# Patient Record
Sex: Male | Born: 1985 | Race: Black or African American | Hispanic: No | Marital: Single | Smoking: Current every day smoker
Health system: Southern US, Community
[De-identification: ages and names within clinical notes are randomized; demographics above are authoritative.]

---
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
## 2006-01-23 ENCOUNTER — Emergency Department: Payer: Self-pay | Admitting: Emergency Medicine

## 2006-04-24 ENCOUNTER — Emergency Department: Payer: Self-pay | Admitting: Emergency Medicine

## 2006-11-20 ENCOUNTER — Emergency Department: Payer: Self-pay

## 2007-01-27 IMAGING — CR DG CHEST 2V
1 series · 2 of 2 positions shown · non-contrast
Comparison: none

REASON FOR EXAM: chest pain
COMMENTS:

PROCEDURE:     DXR - DXR CHEST PA (OR AP) AND LATERAL  - April 30, 2005  [DATE]
RESULT:     Lungs are clear. The cardiac silhouette and visualized bony
skeleton are unremarkable.  Specifically, there does not appear to be
evidence of a pneumothorax.

[Series 1: view not recorded · 0.17mm/px · 2 of 2 slices shown]
[im 1/2]
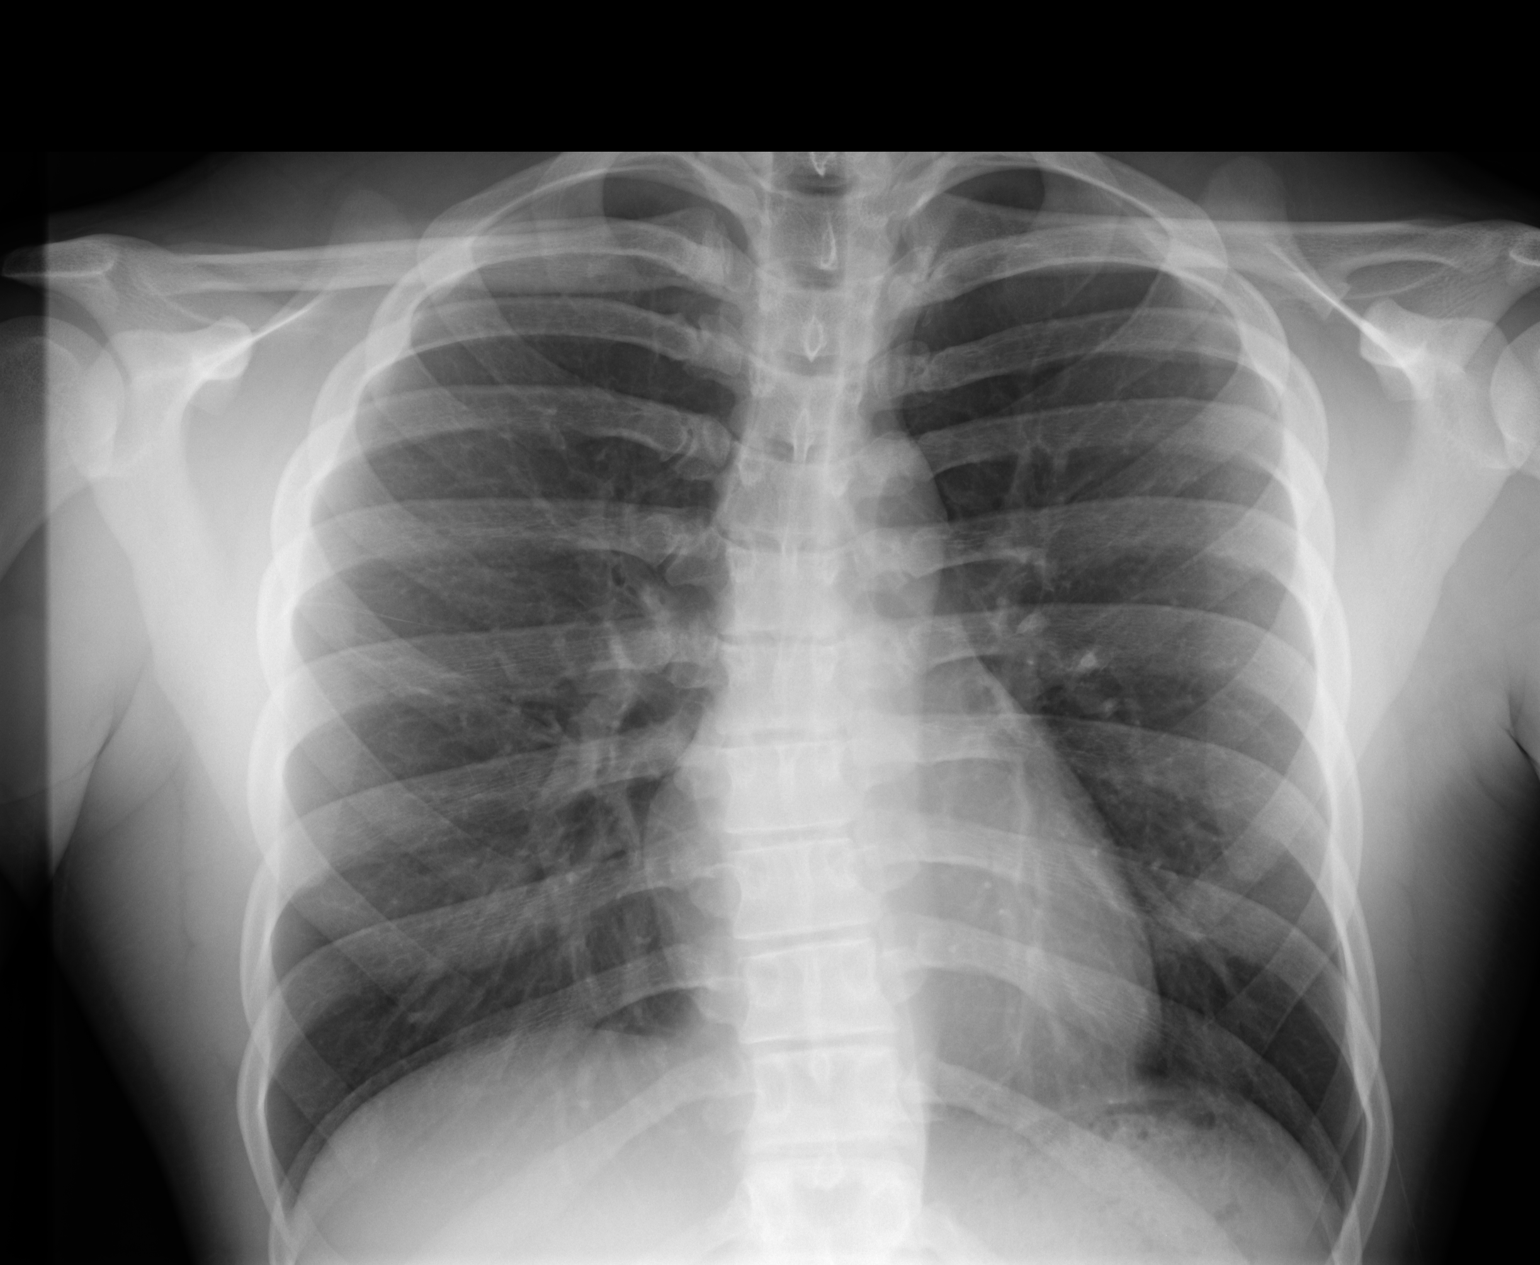
[im 2/2]
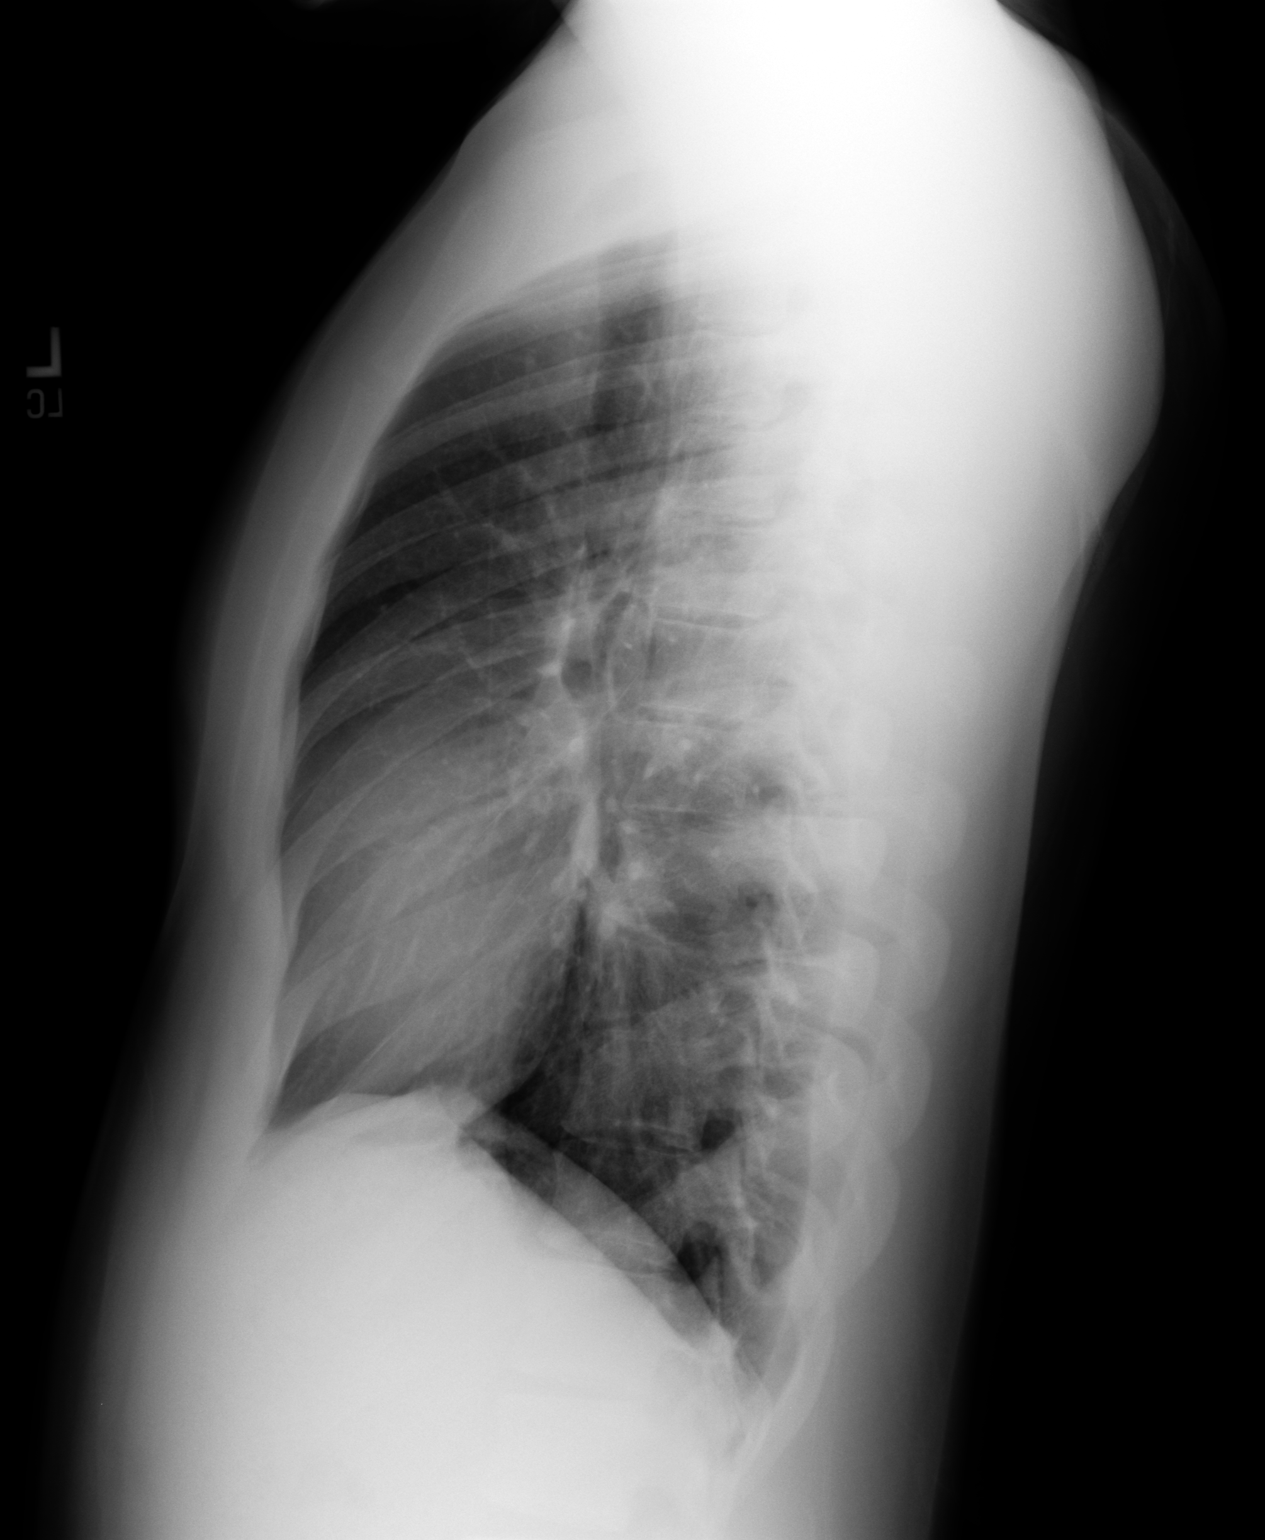

[2 of 2 positions shown; findings below may reference images not displayed]

IMPRESSION: 1)Chest radiograph without evidence of acute cardiopulmonary disease.

## 2010-06-22 ENCOUNTER — Emergency Department (HOSPITAL_COMMUNITY): Admission: EM | Admit: 2010-06-22 | Discharge: 2009-11-15 | Payer: Self-pay | Admitting: Emergency Medicine

## 2011-11-01 ENCOUNTER — Emergency Department: Payer: Self-pay | Admitting: Emergency Medicine

## 2012-11-07 ENCOUNTER — Emergency Department (HOSPITAL_COMMUNITY)
Admission: EM | Admit: 2012-11-07 | Discharge: 2012-11-07 | Disposition: A | Discharge status: Home / Self Care | Payer: Self-pay | Attending: Emergency Medicine | Admitting: Emergency Medicine

## 2012-11-07 ENCOUNTER — Emergency Department (HOSPITAL_COMMUNITY): Payer: Self-pay

## 2012-11-07 ENCOUNTER — Encounter (HOSPITAL_COMMUNITY): Payer: Self-pay | Admitting: Emergency Medicine

## 2012-11-07 DIAGNOSIS — Y9289 Other specified places as the place of occurrence of the external cause: Secondary | ICD-10-CM | POA: Insufficient documentation

## 2012-11-07 DIAGNOSIS — F17200 Nicotine dependence, unspecified, uncomplicated: Secondary | ICD-10-CM | POA: Insufficient documentation

## 2012-11-07 DIAGNOSIS — Y990 Civilian activity done for income or pay: Secondary | ICD-10-CM | POA: Insufficient documentation

## 2012-11-07 DIAGNOSIS — R209 Unspecified disturbances of skin sensation: Secondary | ICD-10-CM | POA: Insufficient documentation

## 2012-11-07 DIAGNOSIS — S46912A Strain of unspecified muscle, fascia and tendon at shoulder and upper arm level, left arm, initial encounter: Secondary | ICD-10-CM

## 2012-11-07 DIAGNOSIS — X500XXA Overexertion from strenuous movement or load, initial encounter: Secondary | ICD-10-CM | POA: Insufficient documentation

## 2012-11-07 DIAGNOSIS — IMO0002 Reserved for concepts with insufficient information to code with codable children: Secondary | ICD-10-CM | POA: Insufficient documentation

## 2012-11-07 MED ORDER — IBUPROFEN 800 MG PO TABS
800.0000 mg | ORAL_TABLET | Freq: Once | ORAL | Status: AC
  Administered 2012-11-07: 800 mg via ORAL
  Filled 2012-11-07: qty 1

## 2012-11-07 MED ORDER — NAPROXEN 500 MG PO TABS: 500.0000 mg | ORAL_TABLET | Freq: Two times a day (BID) | ORAL | Status: DC

## 2012-11-07 MED ORDER — HYDROCODONE-ACETAMINOPHEN 5-325 MG PO TABS
1.0000 | ORAL_TABLET | Freq: Four times a day (QID) | ORAL | Status: DC | PRN
Start: 2012-11-07 — End: 2013-05-18

## 2012-11-07 MED ORDER — HYDROCODONE-ACETAMINOPHEN 5-325 MG PO TABS
1.0000 | ORAL_TABLET | Freq: Once | ORAL | Status: AC
  Administered 2012-11-07: 1 via ORAL
  Filled 2012-11-07: qty 1

## 2012-11-07 NOTE — ED Notes (Signed)
Pt presenting to ed with c/o left shoulder injury today while moving furniture

## 2012-11-07 NOTE — ED Provider Notes (Signed)
History  This chart was scribed for non-physician practitioner Jaci Carrel, PA-C working with Celene Kras, MD, by Candelaria Stagers, ED Scribe. This patient was seen in room WTR9/WTR9 and the patient's care was started at 8:07 PM   CSN: [id number]  Arrival date & time 11/07/12  1818   First MD Initiated Contact with Patient 11/07/12 1934      Chief Complaint  Patient presents with  . Shoulder Pain     The history is provided by the patient. No language interpreter was used.   Caleb Pearson is a 27 y.o. male who presents to the Emergency Department complaining of left shoulder pain that started yesterday after moving furniture while at work and became worse today.  He states that today he felt a pop in the left shoulder when setting down a heavy object.  Pt reports some tingling to the left hand.  Pt states the pain is worse at night.  He has taken nothing for the pain.       History reviewed. No pertinent past medical history.  History reviewed. No pertinent past surgical history.  No family history on file.  History  Substance Use Topics  . Smoking status: Current Every Day Smoker    Types: Cigarettes  . Smokeless tobacco: Not on file  . Alcohol Use: No     Comment: rarely      Review of Systems ROS as mentioned in HPI.   Allergies  Review of patient's allergies indicates no known allergies.  Home Medications   Current Outpatient Rx  Name  Route  Sig  Dispense  Refill  . ibuprofen (ADVIL,MOTRIN) 200 MG tablet   Oral   Take 400 mg by mouth every 6 (six) hours as needed for pain.           BP 117/67  Pulse 71  Temp(Src) 98.4 F (36.9 C) (Oral)  Resp 18  SpO2 99%  Physical Exam  Nursing note and vitals reviewed. Constitutional: He appears well-developed and well-nourished. No distress.  HENT:  Head: Normocephalic and atraumatic.  Eyes: Conjunctivae and EOM are normal.  Neck: Normal range of motion. Neck supple.  Cardiovascular:  Intact distal  pulses, capillary refill < 3 seconds  Musculoskeletal:  Left shoulder: pain w active and passive ROM, ttp over labrum. Able to hold arm at 90 degrees. All other extremities with normal ROM  Neurological:  No sensory deficit  Skin: He is not diaphoretic.  Skin intact, no tenting    ED Course  Procedures   DIAGNOSTIC STUDIES: Oxygen Saturation is 99% on room air, normal by my interpretation.    COORDINATION OF CARE:  8:08 PM Discussed course o fcare with pt which includes conservative treatment for the next ten days.  Pt understands and agrees.   Labs Reviewed - No data to display Dg Shoulder Left  11/07/2012  *RADIOLOGY REPORT*  Clinical Data: Left shoulder injury 4 days ago with pain in the shoulder.  LEFT SHOULDER - 2+ VIEW  Comparison: None.  Findings: Internally rotated view demonstrates inferior spurring or osteochondroma along the lesser tuberosity.  AC joint unremarkable. No acute bony findings.  No dislocation. The acromial undersurface is type 2 (curved).  IMPRESSION:  1.  Inferior spurring or osteochondroma along the lesser tuberosity.   Otherwise, no significant abnormality identified.  If symptoms persist despite conservative therapy, MRI followup may be warranted.   Original Report Authenticated By: Gaylyn Rong, M.D.      No diagnosis found.  MDM  Shoulder strain Patient X-Ray negative for obvious fracture or dislocation. Pain managed in ED. Pt advised to follow up with orthopedics if symptoms persist. Conservative therapy recommended and discussed. Patient will be dc home & is agreeable with above plan.   I personally performed the services described in this documentation, which was scribed in my presence. The recorded information has been reviewed and is accurate.         Jaci Carrel, [place] 11/07/12 2031

## 2012-11-07 NOTE — ED Notes (Signed)
Pt has a ride home.  

## 2012-11-07 NOTE — ED Notes (Signed)
Pt ambulatory to exam room with steady gait. Pt arrives with companion.  

## 2012-11-08 NOTE — ED Provider Notes (Signed)
Medical screening examination/treatment/procedure(s) were performed by non-physician practitioner and as supervising physician I was immediately available for consultation/collaboration.     Shinita Mac R Shaine Newmark, MD 11/08/12 0005 

## 2013-05-18 ENCOUNTER — Emergency Department (HOSPITAL_COMMUNITY)
Admission: EM | Admit: 2013-05-18 | Discharge: 2013-05-18 | Disposition: A | Discharge status: Home / Self Care | Payer: Self-pay | Attending: Emergency Medicine | Admitting: Emergency Medicine

## 2013-05-18 ENCOUNTER — Emergency Department (HOSPITAL_COMMUNITY): Payer: Self-pay

## 2013-05-18 ENCOUNTER — Encounter (HOSPITAL_COMMUNITY): Payer: Self-pay | Admitting: Emergency Medicine

## 2013-05-18 DIAGNOSIS — Y9229 Other specified public building as the place of occurrence of the external cause: Secondary | ICD-10-CM | POA: Insufficient documentation

## 2013-05-18 DIAGNOSIS — S93609A Unspecified sprain of unspecified foot, initial encounter: Secondary | ICD-10-CM | POA: Insufficient documentation

## 2013-05-18 DIAGNOSIS — S96912A Strain of unspecified muscle and tendon at ankle and foot level, left foot, initial encounter: Secondary | ICD-10-CM

## 2013-05-18 DIAGNOSIS — Y990 Civilian activity done for income or pay: Secondary | ICD-10-CM | POA: Insufficient documentation

## 2013-05-18 DIAGNOSIS — R609 Edema, unspecified: Secondary | ICD-10-CM | POA: Insufficient documentation

## 2013-05-18 DIAGNOSIS — T733XXA Exhaustion due to excessive exertion, initial encounter: Secondary | ICD-10-CM | POA: Insufficient documentation

## 2013-05-18 DIAGNOSIS — F17200 Nicotine dependence, unspecified, uncomplicated: Secondary | ICD-10-CM | POA: Insufficient documentation

## 2013-05-18 NOTE — ED Notes (Signed)
Pt states that his left foot has been hurting and swollen for 2 weeks now with no known cause or reason for onset.

## 2013-05-18 NOTE — ED Provider Notes (Addendum)
CSN: [id number]     Arrival date & time 05/18/13  0736 History   First MD Initiated Contact with Patient 05/18/13 0737     Chief Complaint  Patient presents with  . Foot Pain    left   (Consider location/radiation/quality/duration/timing/severity/associated sxs/prior Treatment) Patient is a 27 y.o. male presenting with lower extremity pain. The history is provided by the patient.  Foot Pain This is a new problem. Episode onset: 2 weeks ago. The problem occurs constantly. The problem has been gradually worsening. Associated symptoms comments: Initially just hurt when wearing his work boots while at work.  He works at a [employer] and stands about 9 hours a day.  Then started noticing pain with wearing regular shoes and when walking in general.  Now it is swollen all the time and painful every time he walks.  No trauma. The symptoms are aggravated by walking. The symptoms are relieved by ice and rest. He has tried rest for the symptoms. The treatment provided no relief.    History reviewed. No pertinent past medical history. History reviewed. No pertinent past surgical history. History reviewed. No pertinent family history. History  Substance Use Topics  . Smoking status: Current Every Day Smoker    Types: Cigarettes  . Smokeless tobacco: Not on file  . Alcohol Use: No     Comment: rarely    Review of Systems  Constitutional: Negative for fever.  Musculoskeletal: Positive for joint swelling.  All other systems reviewed and are negative.    Allergies  Review of patient's allergies indicates no known allergies.  Home Medications   Current Outpatient Rx  Name  Route  Sig  Dispense  Refill  . HYDROcodone-acetaminophen (NORCO/VICODIN) 5-325 MG per tablet   Oral   Take 1 tablet by mouth every 6 (six) hours as needed for pain.   15 tablet   0   . ibuprofen (ADVIL,MOTRIN) 200 MG tablet   Oral   Take 400 mg by mouth every 6 (six) hours as needed for pain.         . naproxen  (NAPROSYN) 500 MG tablet   Oral   Take 1 tablet (500 mg total) by mouth 2 (two) times daily.   30 tablet   0    BP 134/80  Pulse 96  Temp(Src) 97.9 F (36.6 C) (Oral)  Resp 14  Ht 6' (1.829 m)  Wt 207 lb (93.895 kg)  BMI 28.07 kg/m2  SpO2 100% Physical Exam  Nursing note and vitals reviewed. Constitutional: He is oriented to person, place, and time. He appears well-developed and well-nourished. No distress.  HENT:  Head: Normocephalic and atraumatic.  Cardiovascular: Normal rate.   Pulmonary/Chest: Effort normal.  Musculoskeletal:       Left foot: He exhibits tenderness, bony tenderness and swelling. He exhibits normal range of motion, no crepitus and no deformity.       Feet:  Neurological: He is alert and oriented to person, place, and time.  Skin: Skin is warm and dry. No rash noted. No erythema.    ED Course  Procedures (including critical care time) Labs Review Labs Reviewed - No data to display Imaging Review Dg Foot Complete Left  05/18/2013   CLINICAL DATA:  Left foot pain the worse over the great toe and metatarsals. No injury.  EXAM: LEFT FOOT - COMPLETE 3+ VIEW  COMPARISON:  None.  FINDINGS: There is no evidence of fracture or dislocation. There is no evidence of arthropathy or other focal bone  abnormality. Soft tissues are unremarkable.  IMPRESSION: Negative.   Electronically Signed   By: Elberta Fortis M.D.   On: 05/18/2013 08:12    EKG Interpretation   None       MDM   1. Strain of foot, left, initial encounter     Patient with 2 weeks left first MTP joint tenderness with progressive swelling. He denies any trauma or injury patient initially noticed it when wearing his work boots and now it hurts constantly. Pain is more severe with weightbearing. 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  On exam mild swelling to the left first MTP joint and tenderness to palpation also significant pain with great toe extension. No tenderness  concerning for plantar fasciitis and no symptoms suggestive of gout or concern for septic joint.  Plain films pending.  8:23 AM Plain films neg.  Conservative measures at this time with insoles, ibuprofen, ice and rest.  Concern for possible early or developing stress fracture.  Will have him f/u with podiatry in 1 week if not improving.  Gwyneth Sprout, MD 05/18/13 [id number]  Gwyneth Sprout, MD 05/18/13 [phone]

## 2014-01-28 ENCOUNTER — Emergency Department (HOSPITAL_COMMUNITY)
Admission: EM | Admit: 2014-01-28 | Discharge: 2014-01-29 | Disposition: A | Discharge status: Home / Self Care | Payer: No Typology Code available for payment source | Attending: Emergency Medicine | Admitting: Emergency Medicine

## 2014-01-28 ENCOUNTER — Emergency Department (HOSPITAL_COMMUNITY): Payer: No Typology Code available for payment source

## 2014-01-28 ENCOUNTER — Encounter (HOSPITAL_COMMUNITY): Payer: Self-pay | Admitting: Emergency Medicine

## 2014-01-28 DIAGNOSIS — F17200 Nicotine dependence, unspecified, uncomplicated: Secondary | ICD-10-CM | POA: Insufficient documentation

## 2014-01-28 DIAGNOSIS — Y9389 Activity, other specified: Secondary | ICD-10-CM | POA: Insufficient documentation

## 2014-01-28 DIAGNOSIS — S4980XA Other specified injuries of shoulder and upper arm, unspecified arm, initial encounter: Secondary | ICD-10-CM | POA: Insufficient documentation

## 2014-01-28 DIAGNOSIS — S46909A Unspecified injury of unspecified muscle, fascia and tendon at shoulder and upper arm level, unspecified arm, initial encounter: Secondary | ICD-10-CM | POA: Insufficient documentation

## 2014-01-28 DIAGNOSIS — M25512 Pain in left shoulder: Secondary | ICD-10-CM

## 2014-01-28 DIAGNOSIS — S060X0A Concussion without loss of consciousness, initial encounter: Secondary | ICD-10-CM | POA: Insufficient documentation

## 2014-01-28 DIAGNOSIS — Y92410 Unspecified street and highway as the place of occurrence of the external cause: Secondary | ICD-10-CM | POA: Insufficient documentation

## 2014-01-28 MED ORDER — KETOROLAC TROMETHAMINE 30 MG/ML IJ SOLN
30.0000 mg | Freq: Once | INTRAMUSCULAR | Status: AC
  Administered 2014-01-28: 30 mg via INTRAMUSCULAR
  Filled 2014-01-28: qty 1
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
## 2014-01-29 MED ORDER — HYDROCODONE-ACETAMINOPHEN 5-325 MG PO TABS: 1.0000 | ORAL_TABLET | Freq: Four times a day (QID) | ORAL | Status: AC | PRN
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
## 2014-01-29 NOTE — ED Provider Notes (Signed)
Medical screening examination/treatment/procedure(s) were performed by non-physician practitioner and as supervising physician I was immediately available for consultation/collaboration.   EKG Interpretation None        Hanley SeamenJohn L Celsa Nordahl, MD 01/29/14 [phone]

## 2014-02-01 ENCOUNTER — Emergency Department (HOSPITAL_COMMUNITY)
Admission: EM | Admit: 2014-02-01 | Discharge: 2014-02-01 | Disposition: A | Discharge status: Home / Self Care | Payer: 59 | Attending: Emergency Medicine | Admitting: Emergency Medicine

## 2014-02-01 ENCOUNTER — Emergency Department (HOSPITAL_COMMUNITY): Payer: 59

## 2014-02-01 ENCOUNTER — Encounter (HOSPITAL_COMMUNITY): Payer: Self-pay | Admitting: Emergency Medicine

## 2014-02-01 DIAGNOSIS — G4489 Other headache syndrome: Secondary | ICD-10-CM | POA: Insufficient documentation

## 2014-02-01 DIAGNOSIS — G8911 Acute pain due to trauma: Secondary | ICD-10-CM | POA: Insufficient documentation

## 2014-02-01 DIAGNOSIS — F17200 Nicotine dependence, unspecified, uncomplicated: Secondary | ICD-10-CM | POA: Insufficient documentation

## 2014-02-01 DIAGNOSIS — M62838 Other muscle spasm: Secondary | ICD-10-CM | POA: Insufficient documentation

## 2014-02-01 DIAGNOSIS — R51 Headache: Secondary | ICD-10-CM | POA: Diagnosis present

## 2014-02-01 MED ORDER — KETOROLAC TROMETHAMINE 30 MG/ML IJ SOLN
30.0000 mg | Freq: Once | INTRAMUSCULAR | Status: AC
  Administered 2014-02-01: 30 mg via INTRAMUSCULAR
  Filled 2014-02-01: qty 1

## 2014-02-01 NOTE — ED Notes (Signed)
Went to room and patient has left.

## 2014-02-01 NOTE — Discharge Instructions (Signed)
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
## 2014-02-04 NOTE — ED Provider Notes (Signed)
Medical screening examination/treatment/procedure(s) were performed by non-physician practitioner and as supervising physician I was immediately available for consultation/collaboration.   EKG Interpretation None       Doug SouSam Galia Rahm, MD 02/04/14 [phone]

## 2014-02-08 ENCOUNTER — Other Ambulatory Visit: Payer: Self-pay | Admitting: Family Medicine

## 2014-02-08 DIAGNOSIS — M542 Cervicalgia: Secondary | ICD-10-CM

## 2014-02-13 ENCOUNTER — Ambulatory Visit
Admission: RE | Admit: 2014-02-13 | Discharge: 2014-02-13 | Disposition: A | Discharge status: Home / Self Care | Payer: 59 | Source: Ambulatory Visit | Attending: Family Medicine | Admitting: Family Medicine

## 2014-02-13 DIAGNOSIS — M542 Cervicalgia: Secondary | ICD-10-CM

## 2014-03-18 ENCOUNTER — Emergency Department (HOSPITAL_COMMUNITY)
Admission: EM | Admit: 2014-03-18 | Discharge: 2014-03-18 | Disposition: A | Discharge status: Home / Self Care | Payer: 59 | Attending: Emergency Medicine | Admitting: Emergency Medicine

## 2014-03-18 ENCOUNTER — Encounter (HOSPITAL_COMMUNITY): Payer: Self-pay | Admitting: Emergency Medicine

## 2014-03-18 DIAGNOSIS — F911 Conduct disorder, childhood-onset type: Secondary | ICD-10-CM | POA: Insufficient documentation

## 2014-03-18 DIAGNOSIS — R454 Irritability and anger: Secondary | ICD-10-CM

## 2014-03-18 DIAGNOSIS — F17200 Nicotine dependence, unspecified, uncomplicated: Secondary | ICD-10-CM | POA: Diagnosis not present

## 2014-03-18 NOTE — Discharge Instructions (Signed)
°Emergency Department Resource Guide °1) Find a Doctor and Pay Out of Pocket °Although you won't have to find out who is covered by your insurance plan, it is a good idea to ask around and get recommendations. You will then need to call the office and see if the doctor you have chosen will accept you as a new patient and what types of options they offer for patients who are self-pay. Some doctors offer discounts or will set up payment plans for their patients who do not have insurance, but you will need to ask so you aren't surprised when you get to your appointment. ° °2) Contact Your Local Health Department °Not all health departments have doctors that can see patients for sick visits, but many do, so it is worth a call to see if yours does. If you don't know where your local health department is, you can check in your phone book. The CDC also has a tool to help you locate your state's health department, and many state websites also have listings of all of their local health departments. ° °3) Find a Walk-in Clinic °If your illness is not likely to be very severe or complicated, you may want to try a walk in clinic. These are popping up all over the country in pharmacies, drugstores, and shopping centers. They're usually staffed by nurse practitioners or physician assistants that have been trained to treat common illnesses and complaints. They're usually fairly quick and inexpensive. However, if you have serious medical issues or chronic medical problems, these are probably not your best option. ° °No Primary Care Doctor: °- Call [facility] at  832-8000 - they can help you locate a primary care doctor that  accepts your insurance, provides certain services, etc. °- Physician Referral Service- 1-800-533-3463 ° °Chronic Pain Problems: °Organization         Address  Phone   Notes  °[facility]  (336) 297-2271 Patients need to be referred by their primary care doctor.  ° °Medication  Assistance: °Organization         Address  Phone   Notes  °[place] [facility] [address] °[place], [place] [address] (336) 641-8030 --Must be a resident of [place] °-- Must have NO insurance coverage whatsoever (no Medicaid/ Medicare, etc.) °-- The pt. MUST have a primary care doctor that directs their care regularly and follows them in the community °  °[facility]  (866) 331-1348   °[employer]  (888) 892-1162   ° °Agencies that provide inexpensive medical care: °Organization         Address  Phone   Notes  °[facility]  (336) 832-8035   °[facility]    (336) 832-7272   °[facility] [address] °[place], [place] [address] (336) 832-4777   °[facility] [address], °[place] (336) 271-4999   °[facility]    (336) 373-0678   °[facility]    (336) 272-1050   °[facility] ° [address], [place] Phone:  (336) 832-4444, Fax:  (336) 832-4440 Hours of Operation:  9 am - 6 pm, M-F.  Also accepts Medicaid/Medicare and self-pay.  °[facility] ° [address], [place] Phone: (336) 832-3150, Fax: (336) 832-3151. Hours of Operation:  8:30 am - 5:30 pm, M-F.  Also accepts Medicaid and self-pay.  °[facility] [place] [address], [place] Phone: (336) 878-6027   °[facility] [address], [place], [place] (336)723-1848, Ext. 123 Mondays & Thursdays: 7-9 AM.  First 15 patients are seen on a first come, first serve basis. °  ° °Medicaid-accepting [place] Providers: ° °Organization         Address  Phone   Notes  °[facility] [address], [place] (336) 641-2100 Also accepts self-pay patients.  °[facility] [address], [place] ° (336) 856-9996   °[facility] [address], [place]  (336) 288-8857   °[facility] [address] [place] [address], [place] (336) 299-7000   °Veita Bland [address], [place]  ° (336) 373-1557 Only accepts [place] Access Medicaid patients after they have their name applied to their card.  ° °Self-Pay (no insurance) in [place]: ° °Organization         Address  Phone   Notes  °Sickle Cell Patients, [facility] [address], [place] (336) 832-1970   °[facility] [address], [place] (336) 832-4400   °[facility] ° [address], [place] (336) 992-4800   °[facility]/Dr. Osei-Bonsu ° [address] [place] [address], [place] or [address], [place] (336) 841-8500 Phone number for both [place] and [place] locations is the same.  °[facility] [address], [place] (336) 299-0000   °[facility] [address] [place] [address], [place] or [address] (336) 852-7530 °(336) 878-2260   °[facility] [address], [place] (336) 350-1642, phone; (336) 294-5005, fax Sees patients 1st and 3rd Saturday of every month.  Must not qualify for public or private insurance (i.e. Medicaid, Medicare, [place] Health Choice, Veterans' Benefits) • Household income should be no more than 200% of the poverty level •The clinic cannot treat you if you are pregnant or think you are pregnant • Sexually transmitted diseases are not treated at the clinic.  ° ° °Dental Care: °Organization         Address  Phone  Notes  °[place] [facility] [address], [place] (336) 641-6152 Accepts children up to age 21 who are enrolled in Medicaid or [place] Health Choice; pregnant women with a Medicaid card; and children who have applied for Medicaid or [place] Health Choice, but were declined, whose parents can pay a reduced fee at time of service.  °[place]  [facility] [place]  [address], [place] (336) 641-7733 Accepts children up to age 21 who are enrolled in Medicaid or [place] Health Choice; pregnant women with a Medicaid card; and children who have applied for Medicaid or [place] Health Choice, but were declined, whose parents can pay a reduced fee at time of service.  °[facility] ° [address], [place] (336) 641-4533 Patients are seen by appointment only. Walk-ins are not accepted. [facility] will see patients 18 years of age and older. °Monday - Tuesday (8am-5pm) °Most Wednesdays (8:30-5pm) °$30 per visit, cash only  °[facility] ° [address], [place] (336) 641-4533 Patients are seen by appointment only. Walk-ins are not accepted. [facility] will see patients 18 years of age and older. °One   Wednesday Evening (Monthly: Volunteer Based).  $30 per visit, cash only  °[facility]  (919) 537-3737 for adults; Children under age 4, call Graduate Pediatric Dentistry at (919) [phone]. Children aged 4-14, please call (919) [phone] to request a pediatric application. ° Dental services are provided in all areas of dental care including fillings, crowns and bridges, complete and partial dentures, implants, gum treatment, root canals, and extractions. Preventive care is also provided. Treatment is provided to both adults and children. °Patients are selected via a lottery and there is often a waiting list. °  °[facility] [address], °[place] ° (336) 763-8833 [email or web address] °  °[facility] [address], [place], [place] (336)723-1848, Ext. 123 Second and Fourth Thursday of each month, opens at 6:30 AM; Clinic ends at 9 AM.  Patients are seen on a first-come first-served basis, and a limited number are seen during each clinic.  ° °[facility] ° [address], [place], [place] (336) 723-7904    Eligibility Requirements °You must have lived in [place], or [place] counties for at least the last three months. °  You cannot be eligible for state or federal sponsored healthcare insurance, including Veterans Administration, Medicaid, or Medicare. °  You generally cannot be eligible for healthcare insurance through your employer.  °  How to apply: °Eligibility screenings are held every Tuesday and Wednesday afternoon from 1:00 pm until 4:00 pm. You do not need an appointment for the interview!  °[facility] [address], [place], [place] 336-631-2330   °[facility]  336-342-8273   °[facility]  336-703-3100   °[facility]  336-570-6415   ° °Behavioral Health Resources in the Community: °Intensive Outpatient Programs °Organization         Address  Phone  Notes  °[facility] [address], [place], [place] 336-878-6098   °[facility] [address], [place], [place] 336-832-9800   °[facility] [address], [place], [place] ° 336-882-2125   °[facility] [address],  °[place], [place] 1-800-853-5163 or 336-641-4981   °Substance Abuse Resources °Organization         Address  Phone  Notes  °[facility]  336-882-2125   °[facility]  336-784-9470   °[facility]  336-285-9073   °[facility]  336-845-3988   °Residential & Outpatient Substance Abuse Program  1-800-659-3381   °Psychological Services °Organization         Address  Phone  Notes  °[facility]  336- 832-9600   °[facility]  336- 378-7881   °[facility] [address], [place] 1-800-853-5163 or 336-641-4981   ° °Mobile Crisis Teams °Organization         Address  Phone  Notes  °[facility], Mobile Crisis Care Unit  1-877-626-1772   °Assertive °[facility] ° [address]  [place], [place] 336-834-9664   °Sharon DeEsch [address] °[place] 336-554-5454   ° °Self-Help/Support Groups °Organization         Address  Phone             Notes  °[facility]  - variety of support groups  336- 373-1402 Call for more information  °Narcotics Anonymous (NA), [facility] [address], °[place]  2 meetings at this location  ° °  Residential Treatment Programs Organization         Address  Phone  Notes  [facility] [address]    [place]  1-610-960-4540   [facility]  [address], [place] [address], [place], [place] 478-295-6213   [facility] [address] [place], [place] 086-578-4696 Admissions: 8am-3pm M-F  [facility] [address],    [place], [place] 295-284-1324   [facility] [address] [place], [place], [place] 401-027-2536   [facility] [address],  [place], [place] 644-034-7425   [facility] Intensive Outpatient [address], [place], [place] 956-387-5643   [facility] ([facility] [address] [place]  [place], [place] 3-295-188-4166 or (717)054-7060   [facility] ([facility] [address], [place], [place] 323-557-3220 Accepts Medicaid  [facility] [address],  [place] 2-542-706-2376 Substance Abuse/Addiction Treatment   [facility]         Address  Phone  Notes  [facility]  (337) 662-2099   Angie Fava, PhD [address] [place], [place]   906-863-4272 or 857-474-9312   [facility]   [address] [place], [place] 310-217-7482   [facility] [address] [place], [place] 409-056-5172 Insurance/Medicaid/sponsorship through [facility] [address]                                    [place], [place] 725-854-1708 Therapy/tele-psych/case    [facility] [address], [place] (517) [phone]    Dr. Lolly Mustache  819-445-2507   [facility]  [employer] [facility]. 1) [address] [place] [address], [place] 2) [address] [place] 3)  [address], [place] [phone]  [phone]   [facility] Child Abuse Hotline (713)391-5973 or 769-002-4787 (After Hours)        Anger Management Anger is a normal human emotion. However, anger can range from mild irritation to rage. When your anger becomes harmful to yourself or others, it is unhealthy anger.  CAUSES  There are many reasons for unhealthy anger. Many people learn how to express anger from observing how their family expressed anger. In troubled, chaotic, or abusive families, anger can be expressed as rage or even violence. Children can grow up never learning how healthy anger can be expressed. Factors that contribute to unhealthy anger include:   Drug or alcohol abuse.  Post-traumatic stress disorder.  Traumatic brain injury. COMPLICATIONS  People with unhealthy anger tend to overreact and retaliate against a real or imagined threat. The need to retaliate can turn into violence or verbal abuse against another person. Chronic anger can lead to health problems, such as hypertension, high blood pressure, and depression. TREATMENT  Exercising, relaxing, meditating, or writing out your feelings all can be beneficial in managing moderate anger. For unhealthy anger, the following methods may be used:  Cognitive-behavioral counseling (learning skills to change the thoughts that influence your mood).  Relaxation training.  Interpersonal counseling.  Assertive communication skills.  Medication. Document Released: 04/29/2007 Document Revised: 09/24/2011 Document Reviewed: 09/07/2010 [facility] Patient Information 2015 [place], [place]. This information is not intended to replace advice given to you by your  health  care provider. Make sure you discuss any questions you have with your health care provider.

## 2014-03-18 NOTE — ED Notes (Signed)
Pt called [facility] this afternoon because he was having homicidal thoughts earlier but not currently. He also suffers from PTSD and has never been treated for it. Pt states that he's anxious and gets angry very quickly. Pt requesting help with PTSD and anxiety.

## 2014-03-19 ENCOUNTER — Ambulatory Visit (HOSPITAL_COMMUNITY)
Admission: RE | Admit: 2014-03-19 | Discharge: 2014-03-19 | Disposition: A | Discharge status: Home / Self Care | Payer: 59 | Attending: Psychiatry | Admitting: Psychiatry
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
## 2014-04-27 ENCOUNTER — Ambulatory Visit (HOSPITAL_COMMUNITY): Payer: Self-pay | Admitting: Psychiatry

## 2014-04-29 ENCOUNTER — Ambulatory Visit (HOSPITAL_COMMUNITY): Payer: Self-pay | Admitting: Psychiatry

## 2014-05-04 ENCOUNTER — Ambulatory Visit (HOSPITAL_COMMUNITY): Payer: Self-pay | Admitting: Licensed Clinical Social Worker
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
## 2017-08-30 ENCOUNTER — Encounter: Payer: Self-pay | Admitting: Emergency Medicine

## 2017-08-30 ENCOUNTER — Emergency Department
Admission: EM | Admit: 2017-08-30 | Discharge: 2017-08-30 | Disposition: A | Discharge status: Home / Self Care | Payer: 59 | Attending: Emergency Medicine | Admitting: Emergency Medicine

## 2017-08-30 ENCOUNTER — Emergency Department: Payer: 59

## 2017-08-30 ENCOUNTER — Other Ambulatory Visit: Payer: Self-pay

## 2017-08-30 DIAGNOSIS — F17210 Nicotine dependence, cigarettes, uncomplicated: Secondary | ICD-10-CM | POA: Insufficient documentation

## 2017-08-30 DIAGNOSIS — N289 Disorder of kidney and ureter, unspecified: Secondary | ICD-10-CM | POA: Insufficient documentation

## 2017-08-30 DIAGNOSIS — R079 Chest pain, unspecified: Secondary | ICD-10-CM | POA: Diagnosis not present

## 2017-08-30 LAB — TROPONIN I: Troponin I: <0.03 ng/mL (ref <0.03)

## 2017-08-30 LAB — CBC
HEMATOCRIT: 43.2 % (ref 40.0–52.0)
Hemoglobin: 14.2 g/dL (ref 13.0–18.0)
MCH: 29.5 pg (ref 26.0–34.0)
MCHC: 32.9 g/dL (ref 32.0–36.0)
MCV: 89.6 fL (ref 80.0–100.0)
Platelets: 276 10*3/uL (ref 150–440)
RBC: 4.82 MIL/uL (ref 4.40–5.90)
RDW: 12.1 % (ref 11.5–14.5)
WBC: 7.4 10*3/uL (ref 3.8–10.6)

## 2017-08-30 LAB — BASIC METABOLIC PANEL
Anion gap: 8 (ref 5–15)
BUN: 18 mg/dL (ref 6–20)
CHLORIDE: 106 mmol/L (ref 101–111)
CO2: 25 mmol/L (ref 22–32)
CREATININE: 1.39 mg/dL — AB (ref 0.61–1.24)
Calcium: 9.2 mg/dL (ref 8.9–10.3)
GFR calc non Af Amer: >60 mL/min (ref >60)
Glucose, Bld: 85 mg/dL (ref 65–99)
POTASSIUM: 4.2 mmol/L (ref 3.5–5.1)
SODIUM: 139 mmol/L (ref 135–145)

## 2017-08-30 MED ORDER — KETOROLAC TROMETHAMINE 60 MG/2ML IM SOLN: 60.0000 mg | Freq: Once | INTRAMUSCULAR | Status: DC

## 2017-08-30 MED ORDER — ACETAMINOPHEN 500 MG PO TABS
1000.0000 mg | ORAL_TABLET | Freq: Once | ORAL | Status: AC
  Administered 2017-08-30: 1000 mg via ORAL
  Filled 2017-08-30: qty 2

## 2017-08-30 NOTE — ED Triage Notes (Signed)
Pt arrives ambulatory to triage with c/o chest pain since 1000 this AM. Pt denies other cardiac symptoms at this time. Pt also reports that it is mid chest pain that radiates straight through to his back. Pt is in NAD at this time.

## 2017-08-30 NOTE — ED Notes (Signed)
Pt to the er for pain in the chest that feels like a sharp pain through the sternum and into the back. Moving upper body, trying to lift anything makes it worse. Pt says pain is worse with deep inspiration. Pt denies cough. Pt states if he is sitting still, he is in no pain. Pt denies any medical problems, or medications.

## 2017-08-30 NOTE — ED Provider Notes (Signed)
[facility] Emergency Department Provider Note  ____________________________________________  Time seen: Approximately 10:00 PM  I have reviewed the triage vital signs and the nursing notes.   HISTORY  Chief Complaint Chest Pain    HPI Caleb Pearson is a 32 y.o. male, otherwise healthy, presenting with chest pain.  The patient reports that he woke up this morning with a sharp central chest pain that is worse with any movement or breathing.  The only thing that makes him feel better is if he sits still.  He has not tried any medicines for his pain.  He has had no associated shortness of breath, lightheadedness or syncope, palpitations, lower extremity swelling or calf pain.  Not had any trauma or recent new exercise regimens.  Pain is not related to food and he has not had any burping, burning sensation or acid taste.  FH: No history of blood clots or early CAD; no history of sudden cardiac death.  SH: Denies tobacco or cocaine use.  History reviewed. No pertinent past medical history.  There are no active problems to display for this patient.   History reviewed. No pertinent surgical history.  Current Outpatient Rx  . Order #: [id number] Class: Print  . Order #: [id number] Class: Historical Med    Allergies Patient has no known allergies.  No family history on file.  Social History Social History   Tobacco Use  . Smoking status: Current Every Day Smoker    Packs/day: 0.50    Types: Cigarettes  . Smokeless tobacco: Never Used  Substance Use Topics  . Alcohol use: No    Comment: rarely  . Drug use: No    Review of Systems Constitutional: No fever/chills.  No lightheadedness or syncope. Eyes: No visual changes. ENT: No sore throat. No congestion or rhinorrhea. Cardiovascular: Positive chest pain. Denies palpitations. Respiratory: Denies shortness of breath.  No cough. Gastrointestinal: No abdominal pain.  No nausea, no vomiting.  No  diarrhea.  No constipation. Genitourinary: Negative for dysuria. Musculoskeletal: Negative for back pain. Skin: Negative for rash. Neurological: Negative for headaches. No focal numbness, tingling or weakness.     ____________________________________________   PHYSICAL EXAM:  VITAL SIGNS: ED Triage Vitals  Enc Vitals Group     BP 08/30/17 2042 117/69     Pulse Rate 08/30/17 2042 76     Resp 08/30/17 2042 18     Temp 08/30/17 2042 98.7 F (37.1 C)     Temp Source 08/30/17 2042 Oral     SpO2 08/30/17 2042 100 %     Weight 08/30/17 2034 186 lb (84.4 kg)     Height 08/30/17 2034 6' (1.829 m)     Head Circumference --      Peak Flow --      Pain Score 08/30/17 2033 10     Pain Loc --      Pain Edu? --      Excl. in GC? --     Constitutional: Alert and oriented. Well appearing and in no acute distress. Answers questions appropriately.  Patient does have some discomfort with any movement. Eyes: Conjunctivae are normal.  EOMI. No scleral icterus. Head: Atraumatic. Nose: No congestion/rhinnorhea. Mouth/Throat: Mucous membranes are moist.  Neck: No stridor.  Supple.  No JVD.  No meningismus. Cardiovascular: Normal rate, regular rhythm. No murmurs, rubs or gallops.  Respiratory: Normal respiratory effort.  No accessory muscle use or retractions. Lungs CTAB.  No wheezes, rales or ronchi. Gastrointestinal: Soft, and nondistended.  Mild  tenderness to palpation in the epigastrium.  No tenderness to palpation in the right upper quadrant; no Murphy sign.  No guarding or rebound.  No peritoneal signs. Musculoskeletal: No LE edema. No ttp in the calves or palpable cords.  Negative Homan's sign. Neurologic:  A&Ox3.  Speech is clear.  Face and smile are symmetric.  EOMI.  Moves all extremities well. Skin:  Skin is warm, dry and intact. No rash noted. Psychiatric: Mood and affect are normal. Speech and behavior are normal.  Normal judgement.  ____________________________________________    LABS (all labs ordered are listed, but only abnormal results are displayed)  Labs Reviewed  BASIC METABOLIC PANEL - Abnormal; Notable for the following components:      Result Value   Creatinine, Ser 1.39 (*)    All other components within normal limits  CBC  TROPONIN I   ____________________________________________  EKG  ED ECG REPORT I, Rockne MenghiniNorman, Anne-Caroline, the attending physician, personally viewed and interpreted this ECG.   Date: 08/30/2017  EKG Time: 2038  Rate: 95  Rhythm: NSR  Axis: normal  Intervals:none  ST&T Change: No STEMI; no evidence of Brugada syndrome, prolonged QTC, or hypertrophy.  ____________________________________________  RADIOLOGY  Dg Chest 2 View  Result Date: 08/30/2017 CLINICAL DATA:  Chest pain since 10 a.m. EXAM: CHEST  2 VIEW COMPARISON:  04/30/2005 FINDINGS: The heart size and mediastinal contours are within normal limits. Both lungs are clear. The visualized skeletal structures are unremarkable. IMPRESSION: No active cardiopulmonary disease. Electronically Signed   By: Tollie Ethavid  Kwon M.D.   On: 08/30/2017 20:58    ____________________________________________   PROCEDURES  Procedure(s) performed: None  Procedures  Critical Care performed: No ____________________________________________   INITIAL IMPRESSION / ASSESSMENT AND PLAN / ED COURSE  Pertinent labs & imaging results that were available during my care of the patient were reviewed by me and considered in my medical decision making (see chart for details).  32 y.o. male, otherwise healthy, presenting with positional central chest pain that he has had all day.  Overall, the patient is hemodynamically stable.  His pain is most consistent with a musculoskeletal pain, although he has not had any trauma or new exercise regimens.   I do not see any evidence of ACS or MI; his EKG does not show ischemia or arrhythmia, his troponin is negative and his chest x-ray does not show any  acute cardiopulmonary process.    Gastrointestinal causes are also possible.  He does have some minimal epigastric discomfort, but does not give a history that would be suggestive of reflux or peptic ulcer disease, given no change with food, no burping, no acid sensation.    Patient does have pain that is worse with breaths, but it is also worse with any movement; his heart rate is normal, his O2 sats are normal, is no evidence of DVT, and he does not have any risk factors for blood clots.  PE is very unlikely, but I have counseled him about red flag symptoms.    The patient's additional laboratory studies are reassuring with the exception of some renal insufficiency.  I have instructed the patient to drink plenty of fluid and I have given him 3 glasses of water here.  He will need to have this rechecked.  He and I spoke at length about not using any NSAID medications until his kidney function has normalized and his epigastric pain is completely resolved.  He will treat his pain with Tylenol.  At this time, the patient  is safe for discharge home.  Return precautions as well as follow-up instructions were discussed.  ____________________________________________  FINAL CLINICAL IMPRESSION(S) / ED DIAGNOSES  Final diagnoses:  Chest pain, unspecified type  Acute renal insufficiency         NEW MEDICATIONS STARTED DURING THIS VISIT:  New Prescriptions   No medications on file      Rockne Menghini, MD 08/30/17 2211

## 2017-08-30 NOTE — Discharge Instructions (Signed)
These drink plenty of fluids to stay well-hydrated and to rehydrate your kidneys.  Please make an appointment with a primary care physician to have your kidney function rechecked and to be reevaluated for your chest pain.  You may take Tylenol, 500 mg to 1000 mg, every 8 hours.  Do not exceed 3000 mg/day.  Please avoid NSAID medications including Advil, ibuprofen, Motrin or Aleve.  Return to the emergency department if you develop severe pain, lightheadedness or fainting, shortness of breath, or any other symptoms concerning to you.

## 2021-08-27 ENCOUNTER — Other Ambulatory Visit: Payer: Self-pay

## 2021-08-27 ENCOUNTER — Ambulatory Visit
Admission: EM | Admit: 2021-08-27 | Discharge: 2021-08-27 | Disposition: A | Discharge status: Home / Self Care | Payer: Non-veteran care | Attending: Internal Medicine | Admitting: Internal Medicine

## 2021-08-27 ENCOUNTER — Encounter: Payer: Self-pay | Admitting: Emergency Medicine

## 2021-08-27 DIAGNOSIS — R112 Nausea with vomiting, unspecified: Secondary | ICD-10-CM | POA: Diagnosis not present

## 2021-08-27 DIAGNOSIS — A084 Viral intestinal infection, unspecified: Secondary | ICD-10-CM | POA: Diagnosis not present

## 2021-08-27 DIAGNOSIS — R197 Diarrhea, unspecified: Secondary | ICD-10-CM | POA: Diagnosis not present

## 2021-08-27 MED ORDER — ONDANSETRON 4 MG PO TBDP: 4.0000 mg | ORAL_TABLET | Freq: Three times a day (TID) | ORAL | 0 refills | Status: AC | PRN

## 2021-08-27 NOTE — ED Triage Notes (Signed)
Patient c/o vomiting, diarrhea, body aches since yesterday, some abdominal cramping.  Patient did take some Pepto Bismol.  Been drinking Pepto Bismol, unable to keep anything down.

## 2021-08-27 NOTE — Discharge Instructions (Signed)
It appears that you have a viral stomach virus that should self resolve in the next few days.  You have been prescribed a nausea medication to take as needed.  Please increase clear oral fluid intake to prevent dehydration.  Go to the hospital if symptoms persist or worsen.

## 2021-08-27 NOTE — ED Provider Notes (Signed)
[facility]    CSN: [id number] Arrival date & time: 08/27/21  1347      History   Chief Complaint Chief Complaint  Patient presents with   Emesis    HPI Caleb Pearson is a 36 y.o. male.   Patient presents with nausea, vomiting, diarrhea, body aches, abdominal cramping that started yesterday.  He reports that his daughter has similar symptoms currently.  Denies any known fevers.  Denies any associated upper respiratory symptoms or sore throat.  Patient has taken Pepto-Bismol for symptoms with minimal improvement.  Has been able to drink fluids but not eat foods.  Denies any blood in stool or emesis.   Emesis  History reviewed. No pertinent past medical history.  There are no problems to display for this patient.   History reviewed. No pertinent surgical history.     Home Medications    Prior to Admission medications   Medication Sig Start Date End Date Taking? Authorizing Provider  ondansetron (ZOFRAN-ODT) 4 MG disintegrating tablet Take 1 tablet (4 mg total) by mouth every 8 (eight) hours as needed for nausea or vomiting. 08/27/21  Yes Gustavus Bryant, FNP  HYDROcodone-acetaminophen (NORCO/VICODIN) 5-325 MG per tablet Take 1-2 tablets by mouth every 6 (six) hours as needed for moderate pain or severe pain. 01/29/14   Roxy Horseman, PA-C  Multiple Vitamin (MULTIVITAMIN WITH MINERALS) TABS tablet Take 1 tablet by mouth daily.    [provider]    Family History Family History  Problem Relation Age of Onset   Healthy Mother    Healthy Father    Healthy Sister    Healthy Brother    Healthy Brother     Social History Social History   Tobacco Use   Smoking status: Every Day    Packs/day: 0.50    Types: Cigarettes   Smokeless tobacco: Never  Substance Use Topics   Alcohol use: No    Comment: rarely   Drug use: No     Allergies   Patient has no known allergies.   Review of Systems Review of Systems Per HPI  Physical  Exam Triage Vital Signs ED Triage Vitals [08/27/21 1418]  Enc Vitals Group     BP 126/88     Pulse Rate 96     Resp 18     Temp 98.1 F (36.7 C)     Temp Source Oral     SpO2 97 %     Weight 207 lb (93.9 kg)     Height 6\' 1"  (1.854 m)     Head Circumference      Peak Flow      Pain Score 7     Pain Loc      Pain Edu?      Excl. in GC?    No data found.  Updated Vital Signs BP 126/88 (BP Location: Left Arm)    Pulse 96    Temp 98.1 F (36.7 C) (Oral)    Resp 18    Ht 6\' 1"  (1.854 m)    Wt 207 lb (93.9 kg)    SpO2 97%    BMI 27.31 kg/m   Visual Acuity Right Eye Distance:   Left Eye Distance:   Bilateral Distance:    Right Eye Near:   Left Eye Near:    Bilateral Near:     Physical Exam Constitutional:      General: He is not in acute distress.    Appearance: Normal appearance. He is  not toxic-appearing or diaphoretic.  HENT:     Head: Normocephalic and atraumatic.     Mouth/Throat:     Pharynx: No posterior oropharyngeal erythema.  Eyes:     Extraocular Movements: Extraocular movements intact.     Conjunctiva/sclera: Conjunctivae normal.  Cardiovascular:     Rate and Rhythm: Normal rate and regular rhythm.     Pulses: Normal pulses.     Heart sounds: Normal heart sounds.  Pulmonary:     Effort: Pulmonary effort is normal. No respiratory distress.     Breath sounds: Normal breath sounds.  Abdominal:     General: Abdomen is flat. Bowel sounds are normal. There is no distension.     Palpations: Abdomen is soft.     Tenderness: There is no abdominal tenderness.  Neurological:     General: No focal deficit present.     Mental Status: He is alert and oriented to person, place, and time. Mental status is at baseline.  Psychiatric:        Mood and Affect: Mood normal.        Behavior: Behavior normal.        Thought Content: Thought content normal.        Judgment: Judgment normal.     UC Treatments / Results  Labs (all labs ordered are listed, but only  abnormal results are displayed) Labs Reviewed - No data to display  EKG   Radiology No results found.  Procedures Procedures (including critical care time)  Medications Ordered in UC Medications - No data to display  Initial Impression / Assessment and Plan / UC Course  I have reviewed the triage vital signs and the nursing notes.  Pertinent labs & imaging results that were available during my care of the patient were reviewed by me and considered in my medical decision making (see chart for details).     Patient's symptoms appear viral in etiology.  Suspect viral gastroenteritis.  Will prescribe ondansetron to take as needed for nausea.  Discussed increasing clear oral fluid intake to prevent dehydration.  No signs of dehydration on exam at this time.  Discussed strict return and ER precautions.  Patient verbalized understanding and was agreeable with plan. Final Clinical Impressions(s) / UC Diagnoses   Final diagnoses:  Viral gastroenteritis  Nausea vomiting and diarrhea     Discharge Instructions      It appears that you have a viral stomach virus that should self resolve in the next few days.  You have been prescribed a nausea medication to take as needed.  Please increase clear oral fluid intake to prevent dehydration.  Go to the hospital if symptoms persist or worsen.    ED Prescriptions     Medication Sig Dispense Auth. Provider   ondansetron (ZOFRAN-ODT) 4 MG disintegrating tablet Take 1 tablet (4 mg total) by mouth every 8 (eight) hours as needed for nausea or vomiting. 20 tablet [place], Acie Fredrickson, [place]      PDMP not reviewed this encounter.   Gustavus Bryant, [place] 08/27/21 [phone]
